# Patient Record
Sex: Female | Born: 1960 | Race: Black or African American | Hispanic: No | Marital: Married | Smoking: Never smoker
Health system: Southern US, Community
[De-identification: ages and names within clinical notes are randomized; demographics above are authoritative.]

## PROBLEM LIST (undated history)

## (undated) DIAGNOSIS — D649 Anemia, unspecified: Secondary | ICD-10-CM

## (undated) HISTORY — PX: OTHER SURGICAL HISTORY: SHX169

---

## 1994-01-07 HISTORY — PX: TUBAL LIGATION: SHX77

## 2000-02-08 ENCOUNTER — Encounter: Admission: RE | Admit: 2000-02-08 | Discharge: 2000-02-08 | Payer: Self-pay | Admitting: Family Medicine

## 2000-02-08 ENCOUNTER — Encounter: Payer: Self-pay | Admitting: Family Medicine

## 2002-03-30 ENCOUNTER — Other Ambulatory Visit: Admission: RE | Admit: 2002-03-30 | Discharge: 2002-03-30 | Payer: Self-pay | Admitting: Obstetrics and Gynecology

## 2005-10-04 ENCOUNTER — Encounter: Admission: RE | Admit: 2005-10-04 | Discharge: 2005-10-04 | Payer: Self-pay | Admitting: Family Medicine

## 2005-11-21 ENCOUNTER — Other Ambulatory Visit: Admission: RE | Admit: 2005-11-21 | Discharge: 2005-11-21 | Payer: Self-pay | Admitting: Obstetrics and Gynecology

## 2006-11-25 ENCOUNTER — Encounter: Admission: RE | Admit: 2006-11-25 | Discharge: 2006-11-25 | Payer: Self-pay | Admitting: Family Medicine

## 2010-10-17 ENCOUNTER — Other Ambulatory Visit: Payer: Self-pay | Admitting: Internal Medicine

## 2010-10-17 ENCOUNTER — Other Ambulatory Visit: Payer: Self-pay | Admitting: Family Medicine

## 2010-10-17 DIAGNOSIS — Z1231 Encounter for screening mammogram for malignant neoplasm of breast: Secondary | ICD-10-CM

## 2010-10-29 ENCOUNTER — Ambulatory Visit
Admission: RE | Admit: 2010-10-29 | Discharge: 2010-10-29 | Disposition: A | Discharge status: Home / Self Care | Payer: 59 | Source: Ambulatory Visit | Attending: Family Medicine | Admitting: Family Medicine

## 2010-10-29 DIAGNOSIS — Z1231 Encounter for screening mammogram for malignant neoplasm of breast: Secondary | ICD-10-CM

## 2010-11-02 ENCOUNTER — Other Ambulatory Visit: Payer: Self-pay | Admitting: Family Medicine

## 2010-11-02 DIAGNOSIS — R928 Other abnormal and inconclusive findings on diagnostic imaging of breast: Secondary | ICD-10-CM

## 2010-11-16 ENCOUNTER — Ambulatory Visit
Admission: RE | Admit: 2010-11-16 | Discharge: 2010-11-16 | Disposition: A | Discharge status: Home / Self Care | Payer: 59 | Source: Ambulatory Visit | Attending: Family Medicine | Admitting: Family Medicine

## 2010-11-16 DIAGNOSIS — R928 Other abnormal and inconclusive findings on diagnostic imaging of breast: Secondary | ICD-10-CM

## 2011-04-09 ENCOUNTER — Other Ambulatory Visit: Payer: Self-pay | Admitting: Family Medicine

## 2011-04-09 DIAGNOSIS — D249 Benign neoplasm of unspecified breast: Secondary | ICD-10-CM

## 2011-05-23 ENCOUNTER — Ambulatory Visit
Admission: RE | Admit: 2011-05-23 | Discharge: 2011-05-23 | Disposition: A | Discharge status: Home / Self Care | Payer: 59 | Source: Ambulatory Visit | Attending: Family Medicine | Admitting: Family Medicine

## 2011-05-23 DIAGNOSIS — D249 Benign neoplasm of unspecified breast: Secondary | ICD-10-CM

## 2011-12-10 ENCOUNTER — Other Ambulatory Visit: Payer: Self-pay | Admitting: Family Medicine

## 2011-12-10 DIAGNOSIS — N63 Unspecified lump in unspecified breast: Secondary | ICD-10-CM

## 2011-12-19 ENCOUNTER — Ambulatory Visit
Admission: RE | Admit: 2011-12-19 | Discharge: 2011-12-19 | Disposition: A | Discharge status: Home / Self Care | Payer: 59 | Source: Ambulatory Visit | Attending: Family Medicine | Admitting: Family Medicine

## 2011-12-19 DIAGNOSIS — N63 Unspecified lump in unspecified breast: Secondary | ICD-10-CM

## 2013-11-19 ENCOUNTER — Other Ambulatory Visit: Payer: Self-pay

## 2013-11-19 DIAGNOSIS — Z1231 Encounter for screening mammogram for malignant neoplasm of breast: Secondary | ICD-10-CM

## 2013-12-01 ENCOUNTER — Ambulatory Visit
Admission: RE | Admit: 2013-12-01 | Discharge: 2013-12-01 | Disposition: A | Discharge status: Home / Self Care | Payer: 59 | Source: Ambulatory Visit

## 2013-12-01 DIAGNOSIS — Z1231 Encounter for screening mammogram for malignant neoplasm of breast: Secondary | ICD-10-CM

## 2014-03-07 HISTORY — PX: COLONOSCOPY: SHX174

## 2014-03-31 ENCOUNTER — Emergency Department (HOSPITAL_COMMUNITY)
Admission: EM | Admit: 2014-03-31 | Discharge: 2014-03-31 | Disposition: A | Discharge status: Home / Self Care | Payer: 59 | Attending: Emergency Medicine | Admitting: Emergency Medicine

## 2014-03-31 ENCOUNTER — Encounter (HOSPITAL_COMMUNITY): Payer: Self-pay | Admitting: Neurology

## 2014-03-31 DIAGNOSIS — N1330 Unspecified hydronephrosis: Secondary | ICD-10-CM | POA: Insufficient documentation

## 2014-03-31 DIAGNOSIS — Z880 Allergy status to penicillin: Secondary | ICD-10-CM | POA: Insufficient documentation

## 2014-03-31 DIAGNOSIS — N201 Calculus of ureter: Secondary | ICD-10-CM | POA: Insufficient documentation

## 2014-03-31 DIAGNOSIS — N200 Calculus of kidney: Secondary | ICD-10-CM | POA: Diagnosis present

## 2014-03-31 LAB — COMPREHENSIVE METABOLIC PANEL
ALT: 16 U/L (ref 0–35)
ANION GAP: 7 (ref 5–15)
AST: 25 U/L (ref 0–37)
Albumin: 3.8 g/dL (ref 3.5–5.2)
Alkaline Phosphatase: 80 U/L (ref 39–117)
BILIRUBIN TOTAL: 0.6 mg/dL (ref 0.3–1.2)
BUN: 14 mg/dL (ref 6–23)
CHLORIDE: 104 mmol/L (ref 96–112)
CO2: 29 mmol/L (ref 19–32)
CREATININE: 1.36 mg/dL — AB (ref 0.50–1.10)
Calcium: 9.3 mg/dL (ref 8.4–10.5)
GFR, EST AFRICAN AMERICAN: 51 mL/min — AB (ref >90)
GFR, EST NON AFRICAN AMERICAN: 44 mL/min — AB (ref >90)
Glucose, Bld: 93 mg/dL (ref 70–99)
Potassium: 3.6 mmol/L (ref 3.5–5.1)
Sodium: 140 mmol/L (ref 135–145)
Total Protein: 7.3 g/dL (ref 6.0–8.3)

## 2014-03-31 LAB — CBC WITH DIFFERENTIAL/PLATELET
BASOS ABS: 0 10*3/uL (ref 0.0–0.1)
Basophils Relative: 0 % (ref 0–1)
EOS PCT: 1 % (ref 0–5)
Eosinophils Absolute: 0.1 10*3/uL (ref 0.0–0.7)
HCT: 37.7 % (ref 36.0–46.0)
Hemoglobin: 12.2 g/dL (ref 12.0–15.0)
LYMPHS PCT: 22 % (ref 12–46)
Lymphs Abs: 3.1 10*3/uL (ref 0.7–4.0)
MCH: 27.9 pg (ref 26.0–34.0)
MCHC: 32.4 g/dL (ref 30.0–36.0)
MCV: 86.1 fL (ref 78.0–100.0)
Monocytes Absolute: 0.9 10*3/uL (ref 0.1–1.0)
Monocytes Relative: 7 % (ref 3–12)
NEUTROS ABS: 9.8 10*3/uL — AB (ref 1.7–7.7)
NEUTROS PCT: 70 % (ref 43–77)
PLATELETS: 273 10*3/uL (ref 150–400)
RBC: 4.38 MIL/uL (ref 3.87–5.11)
RDW: 15.4 % (ref 11.5–15.5)
WBC: 13.9 10*3/uL — AB (ref 4.0–10.5)

## 2014-03-31 LAB — URINALYSIS, ROUTINE W REFLEX MICROSCOPIC
BILIRUBIN URINE: NEGATIVE
Glucose, UA: NEGATIVE mg/dL
KETONES UR: NEGATIVE mg/dL
Leukocytes, UA: NEGATIVE
Nitrite: NEGATIVE
PH: 6 (ref 5.0–8.0)
Protein, ur: NEGATIVE mg/dL
SPECIFIC GRAVITY, URINE: 1.006 (ref 1.005–1.030)
UROBILINOGEN UA: 0.2 mg/dL (ref 0.0–1.0)

## 2014-03-31 LAB — URINE MICROSCOPIC-ADD ON

## 2014-03-31 MED ORDER — FENTANYL CITRATE 0.05 MG/ML IJ SOLN
100.0000 ug | Freq: Once | INTRAMUSCULAR | Status: AC
  Administered 2014-03-31: 100 ug via INTRAVENOUS
  Filled 2014-03-31: qty 2

## 2014-03-31 MED ORDER — SODIUM CHLORIDE 0.9 % IV BOLUS (SEPSIS)
1000.0000 mL | Freq: Once | INTRAVENOUS | Status: AC
  Administered 2014-03-31: 1000 mL via INTRAVENOUS

## 2014-03-31 MED ORDER — ONDANSETRON 4 MG PO TBDP: ORAL_TABLET | ORAL | Status: AC

## 2014-03-31 NOTE — ED Provider Notes (Signed)
CSN: [id number]     Arrival date & time 03/31/14  1538 History   First MD Initiated Contact with Patient 03/31/14 1836     Chief Complaint  Patient presents with  . Nephrolithiasis     (Consider location/radiation/quality/duration/timing/severity/associated sxs/prior Treatment) HPI  54 year old female presents with right-sided flank pain for the past 1 week. Initially intermittent but now is progressively more constant and more severe. The worst pain was early this morning it 4 AM. Currently rates her pain as a 7 out of 10. No known kidney stones in the past. Denies a fevers or chills. Pain now radiating up to her right back. No dysuria. Patient has been taking ibuprofen with moderate relief. She went to her PCP today and had a CT scan done as an outpatient. Was told she had a 5 cm kidney stone on the right and severe blockage of that kidney. Told to go to the ER to be evaluated by urology for immediate surgery.  History reviewed. No pertinent past medical history. History reviewed. No pertinent past surgical history. No family history on file. History  Substance Use Topics  . Smoking status: Never Smoker   . Smokeless tobacco: Not on file  . Alcohol Use: No   OB History    No data available     Review of Systems  Constitutional: Negative for fever and chills.  Gastrointestinal: Positive for nausea and abdominal pain. Negative for vomiting.  Genitourinary: Positive for flank pain. Negative for dysuria.  Musculoskeletal: Positive for back pain.  All other systems reviewed and are negative.     Allergies  Aspirin; Codeine; and Penicillins  Home Medications   Prior to Admission medications   Not on File   BP 143/90 mmHg  Pulse 85  Temp(Src) 98.5 F (36.9 C) (Oral)  Resp 18  SpO2 100%  LMP 03/18/2014 Physical Exam  Constitutional: She is oriented to person, place, and time. She appears well-developed and well-nourished.  HENT:  Head: Normocephalic and atraumatic.   Right Ear: External ear normal.  Left Ear: External ear normal.  Nose: Nose normal.  Eyes: Right eye exhibits no discharge. Left eye exhibits no discharge.  Cardiovascular: Normal rate, regular rhythm and normal heart sounds.   Pulmonary/Chest: Effort normal and breath sounds normal.  Abdominal: Soft. She exhibits no distension. There is no tenderness. There is CVA tenderness (right).  Right flank tenderness  Neurological: She is alert and oriented to person, place, and time.  Skin: Skin is warm and dry. She is not diaphoretic.  Nursing note and vitals reviewed.   ED Course  Procedures (including critical care time) Labs Review Labs Reviewed  CBC WITH DIFFERENTIAL/PLATELET - Abnormal; Notable for the following:    WBC 13.9 (*)    Neutro Abs 9.8 (*)    All other components within normal limits  COMPREHENSIVE METABOLIC PANEL - Abnormal; Notable for the following:    Creatinine, Ser 1.36 (*)    GFR calc non Af Amer 44 (*)    GFR calc Af Amer 51 (*)    All other components within normal limits  URINALYSIS, ROUTINE W REFLEX MICROSCOPIC - Abnormal; Notable for the following:    Hgb urine dipstick SMALL (*)    All other components within normal limits  URINE MICROSCOPIC-ADD ON - Abnormal; Notable for the following:    Squamous Epithelial / LPF FEW (*)    All other components within normal limits  POC URINE PREG, ED    Imaging Review No results found.  EKG Interpretation None      MDM   Final diagnoses:  Right ureteral stone  Hydronephrosis, right    Able to obtain the CT report from outside imaging center, refers to a 5 mm kidney stone in the proximal right ureter. Notes hydronephrosis does not give a severity. Patient's pain controlled with one dose of IV narcotics. No vomiting, fever, or dysuria. Discussed with the urologist on call, Dr. Tresa Moore, who feels patient is stable for discharge after hearing the report and will follow-up patient in their office in 3 days.  (after weekend). Patient advised of strict return precautions. She was already given a strainer, Flomax, and Vicodin by her PCP. Will add on on Zofran as needed. Stable for discharge.    Sherwood Gambler, MD 04/01/14 [phone]

## 2014-03-31 NOTE — ED Notes (Signed)
Pt ambulated to restroom w/ steady gait.

## 2014-03-31 NOTE — Discharge Instructions (Signed)
Kidney Stones °Kidney stones (urolithiasis) are deposits that form inside your kidneys. The intense pain is caused by the stone moving through the urinary tract. When the stone moves, the ureter goes into spasm around the stone. The stone is usually passed in the urine.  °CAUSES  °· A disorder that makes certain neck glands produce too much parathyroid hormone (primary hyperparathyroidism). °· A buildup of uric acid crystals, similar to gout in your joints. °· Narrowing (stricture) of the ureter. °· A kidney obstruction present at birth (congenital obstruction). °· Previous surgery on the kidney or ureters. °· Numerous kidney infections. °SYMPTOMS  °· Feeling sick to your stomach (nauseous). °· Throwing up (vomiting). °· Blood in the urine (hematuria). °· Pain that usually spreads (radiates) to the groin. °· Frequency or urgency of urination. °DIAGNOSIS  °· Taking a history and physical exam. °· Blood or urine tests. °· CT scan. °· Occasionally, an examination of the inside of the urinary bladder (cystoscopy) is performed. °TREATMENT  °· Observation. °· Increasing your fluid intake. °· Extracorporeal shock wave lithotripsy--This is a noninvasive procedure that uses shock waves to break up kidney stones. °· Surgery may be needed if you have severe pain or persistent obstruction. There are various surgical procedures. Most of the procedures are performed with the use of small instruments. Only small incisions are needed to accommodate these instruments, so recovery time is minimized. °The size, location, and chemical composition are all important variables that will determine the proper choice of action for you. Talk to your health care provider to better understand your situation so that you will minimize the risk of injury to yourself and your kidney.  °HOME CARE INSTRUCTIONS  °· Drink enough water and fluids to keep your urine clear or pale yellow. This will help you to pass the stone or stone fragments. °· Strain  all urine through the provided strainer. Keep all particulate matter and stones for your health care provider to see. The stone causing the pain may be as small as a grain of salt. It is very important to use the strainer each and every time you pass your urine. The collection of your stone will allow your health care provider to analyze it and verify that a stone has actually passed. The stone analysis will often identify what you can do to reduce the incidence of recurrences. °· Only take over-the-counter or prescription medicines for pain, discomfort, or fever as directed by your health care provider. °· Make a follow-up appointment with your health care provider as directed. °· Get follow-up X-rays if required. The absence of pain does not always mean that the stone has passed. It may have only stopped moving. If the urine remains completely obstructed, it can cause loss of kidney function or even complete destruction of the kidney. It is your responsibility to make sure X-rays and follow-ups are completed. Ultrasounds of the kidney can show blockages and the status of the kidney. Ultrasounds are not associated with any radiation and can be performed easily in a matter of minutes. °SEEK MEDICAL CARE IF: °· You experience pain that is progressive and unresponsive to any pain medicine you have been prescribed. °SEEK IMMEDIATE MEDICAL CARE IF:  °· Pain cannot be controlled with the prescribed medicine. °· You have a fever or shaking chills. °· The severity or intensity of pain increases over 18 hours and is not relieved by pain medicine. °· You develop a new onset of abdominal pain. °· You feel faint or pass out. °·   You are unable to urinate. MAKE SURE YOU:   Understand these instructions.  Will watch your condition.  Will get help right away if you are not doing well or get worse. Document Released: 12/24/2004 Document Revised: 08/26/2012 Document Reviewed: 05/27/2012 [facility] Patient Information 2015  [place], [place]. This information is not intended to replace advice given to you by your health care provider. Make sure you discuss any questions you have with your health care provider.     Hydronephrosis Hydronephrosis is an abnormal enlargement of your kidney. It can affect one or both the kidneys. It results from the backward pressure of urine on the kidneys, when the flow of urine is blocked. Normally, the urine drains from the kidney through the urine tube (ureter), into a sac which holds the urine until urination (bladder). When the urinary flow is blocked, the urine collects above the block. This causes an increase in the pressure inside the kidney, which in turn leads to its enlargement. The block can occur at the point where the kidney joins the ureter. Treatment depends on the cause and location of the block.  CAUSES  The causes of this condition include:  Birth defect of the kidney or ureter.  Kink at the point where the kidney joins the ureter.  Stones and blood clots in the kidney or ureter.  Cancer, injury, or infection of the ureter.  Scar tissue formation.  Backflow of urine (reflux).  Cancer of bladder or prostate gland.  Abnormality of the nerves or muscles of the kidney or ureter.  Lower part of the ureter protruding into the bladder (ureterocele).  Abnormal contractions of the bladder.  Both the kidneys can be affected during pregnancy. This is because the enlarging uterus presses on the ureters and blocks the flow of urine. SYMPTOMS  The symptoms depend on the location of the block. They also depend on how long the block has been present. You may feel pain on the affected side. Sometimes, you may not have any symptoms. There may be a dull ache or discomfort in the flank. The common symptoms are:  Flank pain.  Swelling of the abdomen.  Pain in the abdomen.  Nausea and vomiting.  Fever.  Pain while passing urine.  Urgency for urination.  Frequent or  urgent urination.  Infection of the urinary tract. DIAGNOSIS  Your caregiver will examine you after asking about your symptoms. You may be asked to do blood and urine tests. Your caregiver may order a special X-ray, ultrasound, or CT scan. Sometimes a rigid or flexible telescope (cystoscope) is used to view the site of the blockage.  TREATMENT  Treatment depends on the site, cause, and duration of the block. The goal of treatment is to remove the blockage. Your caregiver will plan the treatment based on your condition. The different types of treatment are:   Putting in a soft plastic tube (ureteral stent) to connect the bladder with the kidney. This will help in draining the urine.  Putting in a soft tube (nephrostomy tube). This is placed through skin into the kidney. The trapped urine is drained out through the back. A plastic bag is attached to your skin to hold the urine that has drained out.  Antibiotics to treat or prevent infection.  Breaking down of the stone (lithotripsy). HOME CARE INSTRUCTIONS   It may take some time for the hydronephrosis to go away (resolve). Drink fluids as directed by your caregiver , and get a lot of rest.  If you  have a drain in, your caregiver will give you directions about how to care for it. Be sure you understand these directions completely before you go home.  Take any antibiotics, pain medications, or other prescriptions exactly as prescribed.  Follow-up with your caregivers as directed. SEEK MEDICAL CARE IF:   You continue to have flank pain, nausea, or difficulty with urination.  You have any problem with any type of drainage device.  Your urine becomes cloudy or bloody. SEEK IMMEDIATE MEDICAL CARE IF:   You have severe flank and/or abdominal pain.  You develop vomiting and are unable to hold down fluids.  You develop a fever above 100.5 F (38.1 C), or as per your caregiver. MAKE SURE YOU:   Understand these instructions.  Will  watch your condition.  Will get help right away if you are not doing well or get worse. Document Released: 10/21/2006 Document Revised: 03/18/2011 Document Reviewed: 12/07/2009 [facility] Patient Information 2015 [place], [place]. This information is not intended to replace advice given to you by your health care provider. Make sure you discuss any questions you have with your health care provider.

## 2014-03-31 NOTE — ED Notes (Addendum)
Pt reports 5 mm kidney stone to right sisde, sent here from [facility] to have kidney stone removed. Reports had CT scan and told fluid is backing up. Pt is a x 4. In NAD

## 2014-04-06 ENCOUNTER — Other Ambulatory Visit: Payer: Self-pay | Admitting: Urology

## 2014-04-07 ENCOUNTER — Encounter (HOSPITAL_COMMUNITY): Payer: Self-pay | Admitting: *Deleted

## 2014-04-14 ENCOUNTER — Ambulatory Visit (HOSPITAL_COMMUNITY): Payer: 59

## 2014-04-14 ENCOUNTER — Encounter (HOSPITAL_COMMUNITY)
Admission: RE | Disposition: A | Discharge status: Home / Self Care | Payer: Self-pay | Source: Ambulatory Visit | Attending: Urology

## 2014-04-14 ENCOUNTER — Encounter (HOSPITAL_COMMUNITY): Payer: Self-pay | Admitting: General Practice

## 2014-04-14 ENCOUNTER — Ambulatory Visit (HOSPITAL_COMMUNITY)
Admission: RE | Admit: 2014-04-14 | Discharge: 2014-04-14 | Disposition: A | Discharge status: Home / Self Care | Payer: 59 | Source: Ambulatory Visit | Attending: Urology | Admitting: Urology

## 2014-04-14 DIAGNOSIS — Z888 Allergy status to other drugs, medicaments and biological substances status: Secondary | ICD-10-CM | POA: Diagnosis not present

## 2014-04-14 DIAGNOSIS — N201 Calculus of ureter: Secondary | ICD-10-CM | POA: Diagnosis not present

## 2014-04-14 DIAGNOSIS — Z880 Allergy status to penicillin: Secondary | ICD-10-CM | POA: Diagnosis not present

## 2014-04-14 DIAGNOSIS — Z886 Allergy status to analgesic agent status: Secondary | ICD-10-CM | POA: Insufficient documentation

## 2014-04-14 HISTORY — DX: Anemia, unspecified: D64.9

## 2014-04-14 LAB — PREGNANCY, URINE: PREG TEST UR: NEGATIVE

## 2014-04-14 SURGERY — LITHOTRIPSY, ESWL
Anesthesia: LOCAL | Laterality: Right

## 2014-04-14 MED ORDER — DIAZEPAM 5 MG PO TABS
10.0000 mg | ORAL_TABLET | ORAL | Status: AC
  Administered 2014-04-14: 10 mg via ORAL
  Filled 2014-04-14: qty 2

## 2014-04-14 MED ORDER — CIPROFLOXACIN HCL 500 MG PO TABS
500.0000 mg | ORAL_TABLET | ORAL | Status: AC
  Administered 2014-04-14: 500 mg via ORAL
  Filled 2014-04-14: qty 1

## 2014-04-14 MED ORDER — DEXTROSE-NACL 5-0.45 % IV SOLN
INTRAVENOUS | Status: DC
  Administered 2014-04-14: 12:00:00 via INTRAVENOUS

## 2014-04-14 MED ORDER — DIPHENHYDRAMINE HCL 25 MG PO CAPS
25.0000 mg | ORAL_CAPSULE | ORAL | Status: AC
  Administered 2014-04-14: 25 mg via ORAL
  Filled 2014-04-14: qty 1

## 2014-04-14 NOTE — Op Note (Signed)
Refer to [facility] Op Note scanned in the chart 

## 2014-04-14 NOTE — H&P (Signed)
History of Present Illness Sandra Vazquez has been complaining of right sided abdominal pain on and off since July. The pain has been dull in nature and of short duration. She takes ibuprofen as needed and that usually relieved the pain.  The pain has been more frequent over the last 2 weeks. It was severe about 8 days ago. She took ibuprofen and went to see by her PCP who requested a CT scan that showed a 5 mm right proximal ureteral calculus with mild to moderate hydronephrosis. She was then referred to the ER where she was given fentanyl that relieved her pain. She was discharged home on tamsulosin, oxycodone and ondansetron. She has been taking hydrocodone on and off. She does not have any discomfort at this time. The pain is associated with nausea, no vomiting. She denies any voiding symptoms. Her serum calcium is 9.3. Creatinine is 1.36.   Past Medical History Problems  1. History of No acute medical problems  Surgical History Problems  1. History of Cesarean Section 2. History of Oral Surgery Tooth Extraction  Current Meds 1. Allegra CAPS;  Therapy: (Recorded:29Mar2016) to Recorded 2. Beano TABS;  Therapy: (Recorded:29Mar2016) to Recorded 3. Ferrous Sulfate 325 MG CAPS;  Therapy: (Recorded:29Mar2016) to Recorded 4. Flomax 0.4 MG Oral Capsule (Tamsulosin HCl);  Therapy: (Recorded:29Mar2016) to Recorded 5. Hydrocodone-Acetaminophen CAPS;  Therapy: (Recorded:29Mar2016) to Recorded 6. Ibuprofen 200 MG Oral Tablet;  Therapy: (Recorded:29Mar2016) to Recorded 7. Multi Vitamin/Minerals TABS;  Therapy: (Recorded:29Mar2016) to Recorded  Allergies Medication  1. Aspirin TABS 2. Codeine Derivatives 3. Penicillins  Family History Problems  1. Family history of kidney stones (Z84.1) : Paternal Grandmother 2. No pertinent family history : Mother, Father  Social History Problems    Denied: History of Alcohol use   Caffeine use (F15.90)   Married   Never a smoker   Occupation    Science writer Work   Two children  Review of Systems Genitourinary, constitutional, skin, eye, otolaryngeal, hematologic/lymphatic, cardiovascular, pulmonary, endocrine, musculoskeletal, gastrointestinal, neurological and psychiatric system(s) were reviewed and pertinent findings if present are noted and are otherwise negative.  Genitourinary: nocturia.  Gastrointestinal: nausea, flank pain and abdominal pain.    Vitals Vital Signs [Data Includes: Last 1 Day]  Recorded: 29Mar2016 02:37PM  Height: 5 ft 0.5 in Weight: 185 lb  BMI Calculated: 35.54 BSA Calculated: 1.82 Blood Pressure: 127 / 88 Temperature: 97.7 F Heart Rate: 105  Physical Exam Constitutional: Well nourished and well developed . No acute distress.  ENT:. The ears and nose are normal in appearance.  Neck: The appearance of the neck is normal and no neck mass is present.  Pulmonary: No respiratory distress and normal respiratory rhythm and effort.  Cardiovascular: Heart rate and rhythm are normal . No peripheral edema.  Abdomen: The abdomen is soft and nontender. No masses are palpated. Mild tenderness in the RLQ is present. mild right CVA tenderness no CVA tenderness. No hernias are palpable. No hepatosplenomegaly noted.  Genitourinary:  Chaperone Present: Berneice Gandy.  Lymphatics: The femoral and inguinal nodes are not enlarged or tender.  Skin: Normal skin turgor, no visible rash and no visible skin lesions.  Neuro/Psych:. Mood and affect are appropriate.    Results/Data Urine [Data Includes: Last 1 Day]   [id number]  COLOR STRAW   APPEARANCE CLEAR   SPECIFIC GRAVITY 1.010   pH 5.5   GLUCOSE NEG mg/dL  BILIRUBIN NEG   KETONE NEG mg/dL  BLOOD NEG   PROTEIN NEG mg/dL  UROBILINOGEN 0.2 mg/dL  NITRITE  NEG   LEUKOCYTE ESTERASE NEG    I independently reviewed the CT scan and the findings are as noted above.   Assessment Assessed  1. Calculus of proximal right ureter (N20.1) 2. Hydronephrosis, right  (N13.30)  Plan Health Maintenance  1. UA With REFLEX; [Do Not Release]; Status:Complete;   Done: [id number] 01:52PM  I went over the treatment options with the patient, her husband and her daughter: medical expulsive therapy, ESL, ureteroscopy with stone manipulation. the risks, benefits of each option were reviewed. After lengthy discussion with her family she opted to proceed with ESL. The risks of ESL include but are not limited to hemorrhage, renal or perirenal hematoma, injury to adjacent organs, inability to fragment the stone, steinstrasse. They understand and she wishes to proceed.

## 2014-11-02 ENCOUNTER — Other Ambulatory Visit: Payer: Self-pay

## 2014-11-02 DIAGNOSIS — Z1231 Encounter for screening mammogram for malignant neoplasm of breast: Secondary | ICD-10-CM

## 2014-12-06 ENCOUNTER — Ambulatory Visit
Admission: RE | Admit: 2014-12-06 | Discharge: 2014-12-06 | Disposition: A | Discharge status: Home / Self Care | Payer: 59 | Source: Ambulatory Visit

## 2014-12-06 DIAGNOSIS — Z1231 Encounter for screening mammogram for malignant neoplasm of breast: Secondary | ICD-10-CM

## 2016-01-09 DIAGNOSIS — Z01411 Encounter for gynecological examination (general) (routine) with abnormal findings: Secondary | ICD-10-CM | POA: Diagnosis not present

## 2016-01-09 DIAGNOSIS — Z1159 Encounter for screening for other viral diseases: Secondary | ICD-10-CM | POA: Diagnosis not present

## 2016-01-09 DIAGNOSIS — Z124 Encounter for screening for malignant neoplasm of cervix: Secondary | ICD-10-CM | POA: Diagnosis not present

## 2016-01-09 DIAGNOSIS — Z0000 Encounter for general adult medical examination without abnormal findings: Secondary | ICD-10-CM | POA: Diagnosis not present

## 2017-01-14 DIAGNOSIS — Z131 Encounter for screening for diabetes mellitus: Secondary | ICD-10-CM | POA: Diagnosis not present

## 2017-01-14 DIAGNOSIS — Z0000 Encounter for general adult medical examination without abnormal findings: Secondary | ICD-10-CM | POA: Diagnosis not present

## 2017-07-15 DIAGNOSIS — H25013 Cortical age-related cataract, bilateral: Secondary | ICD-10-CM | POA: Diagnosis not present

## 2017-11-28 ENCOUNTER — Other Ambulatory Visit: Payer: Self-pay | Admitting: Family Medicine

## 2017-11-28 DIAGNOSIS — Z1231 Encounter for screening mammogram for malignant neoplasm of breast: Secondary | ICD-10-CM

## 2017-12-18 ENCOUNTER — Ambulatory Visit
Admission: RE | Admit: 2017-12-18 | Discharge: 2017-12-18 | Disposition: A | Discharge status: Home / Self Care | Payer: 59 | Source: Ambulatory Visit | Attending: Family Medicine | Admitting: Family Medicine

## 2017-12-18 DIAGNOSIS — Z1231 Encounter for screening mammogram for malignant neoplasm of breast: Secondary | ICD-10-CM | POA: Diagnosis not present

## 2018-01-12 DIAGNOSIS — Z01411 Encounter for gynecological examination (general) (routine) with abnormal findings: Secondary | ICD-10-CM | POA: Diagnosis not present

## 2018-01-12 DIAGNOSIS — Z6836 Body mass index (BMI) 36.0-36.9, adult: Secondary | ICD-10-CM | POA: Diagnosis not present

## 2018-01-12 DIAGNOSIS — Z124 Encounter for screening for malignant neoplasm of cervix: Secondary | ICD-10-CM | POA: Diagnosis not present

## 2018-02-04 DIAGNOSIS — E785 Hyperlipidemia, unspecified: Secondary | ICD-10-CM | POA: Diagnosis not present

## 2018-02-04 DIAGNOSIS — D649 Anemia, unspecified: Secondary | ICD-10-CM | POA: Diagnosis not present

## 2018-02-04 DIAGNOSIS — Z0000 Encounter for general adult medical examination without abnormal findings: Secondary | ICD-10-CM | POA: Diagnosis not present

## 2018-02-04 DIAGNOSIS — Z131 Encounter for screening for diabetes mellitus: Secondary | ICD-10-CM | POA: Diagnosis not present

## 2019-01-20 ENCOUNTER — Other Ambulatory Visit: Payer: Self-pay | Admitting: Family Medicine

## 2019-01-20 DIAGNOSIS — Z1231 Encounter for screening mammogram for malignant neoplasm of breast: Secondary | ICD-10-CM

## 2019-01-21 ENCOUNTER — Ambulatory Visit
Admission: RE | Admit: 2019-01-21 | Discharge: 2019-01-21 | Disposition: A | Discharge status: Home / Self Care | Payer: 59 | Source: Ambulatory Visit | Attending: Family Medicine | Admitting: Family Medicine

## 2019-01-21 ENCOUNTER — Other Ambulatory Visit: Payer: Self-pay

## 2019-01-21 DIAGNOSIS — Z1231 Encounter for screening mammogram for malignant neoplasm of breast: Secondary | ICD-10-CM

## 2020-02-11 ENCOUNTER — Other Ambulatory Visit: Payer: Self-pay | Admitting: Family Medicine

## 2020-02-11 DIAGNOSIS — Z1231 Encounter for screening mammogram for malignant neoplasm of breast: Secondary | ICD-10-CM

## 2020-02-23 DIAGNOSIS — Z1231 Encounter for screening mammogram for malignant neoplasm of breast: Secondary | ICD-10-CM

## 2020-04-06 ENCOUNTER — Other Ambulatory Visit: Payer: Self-pay

## 2020-04-06 ENCOUNTER — Ambulatory Visit
Admission: RE | Admit: 2020-04-06 | Discharge: 2020-04-06 | Disposition: A | Discharge status: Home / Self Care | Payer: 59 | Source: Ambulatory Visit | Attending: Family Medicine | Admitting: Family Medicine

## 2020-04-06 DIAGNOSIS — Z1231 Encounter for screening mammogram for malignant neoplasm of breast: Secondary | ICD-10-CM

## 2020-04-06 IMAGING — MG MM DIGITAL SCREENING BILAT W/ TOMO AND CAD
8 series · 8 of 24 positions shown · non-contrast
Comparison: Previous exam(s).

CLINICAL DATA: Screening.

EXAM:
DIGITAL SCREENING BILATERAL MAMMOGRAM WITH TOMOSYNTHESIS AND CAD
TECHNIQUE: Bilateral screening digital craniocaudal and mediolateral oblique
mammograms were obtained. Bilateral screening digital breast
tomosynthesis was performed. The images were evaluated with
computer-aided detection.

[R MLO synth-2D]
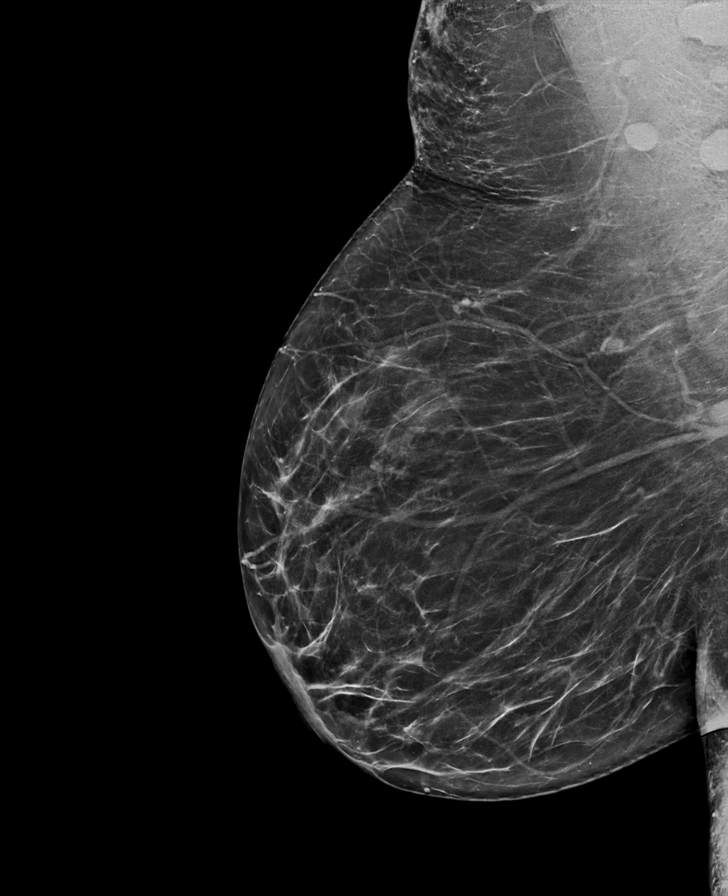

[L CC synth-2D]
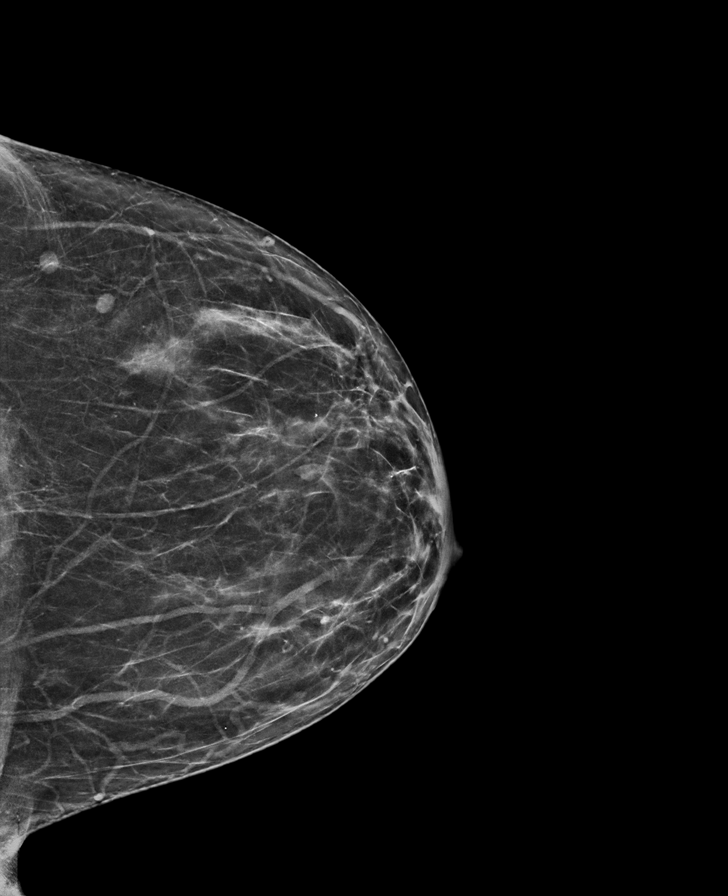

[L MLO synth-2D]
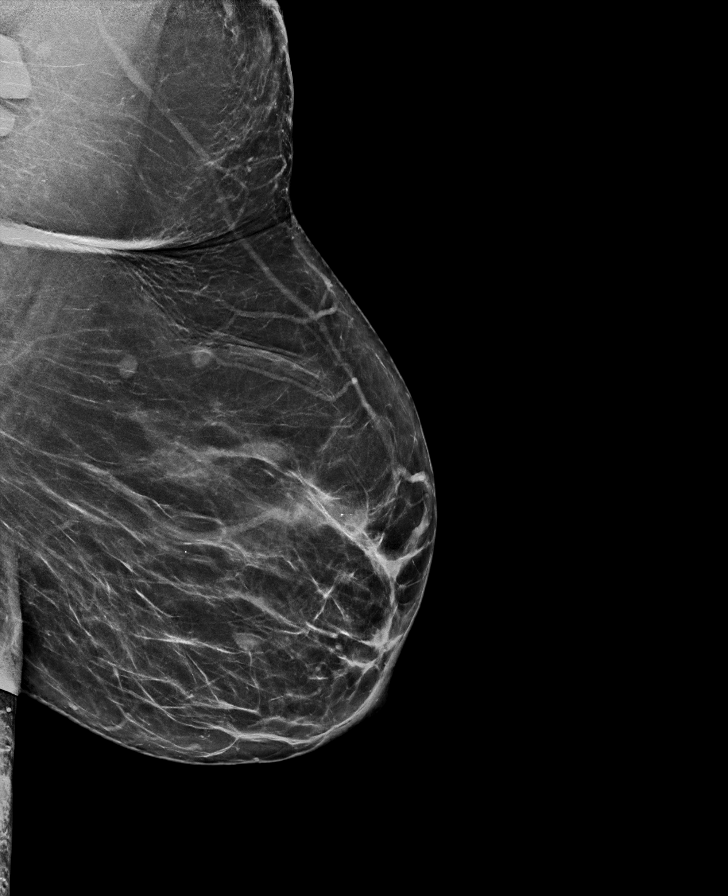

[R CC synth-2D]
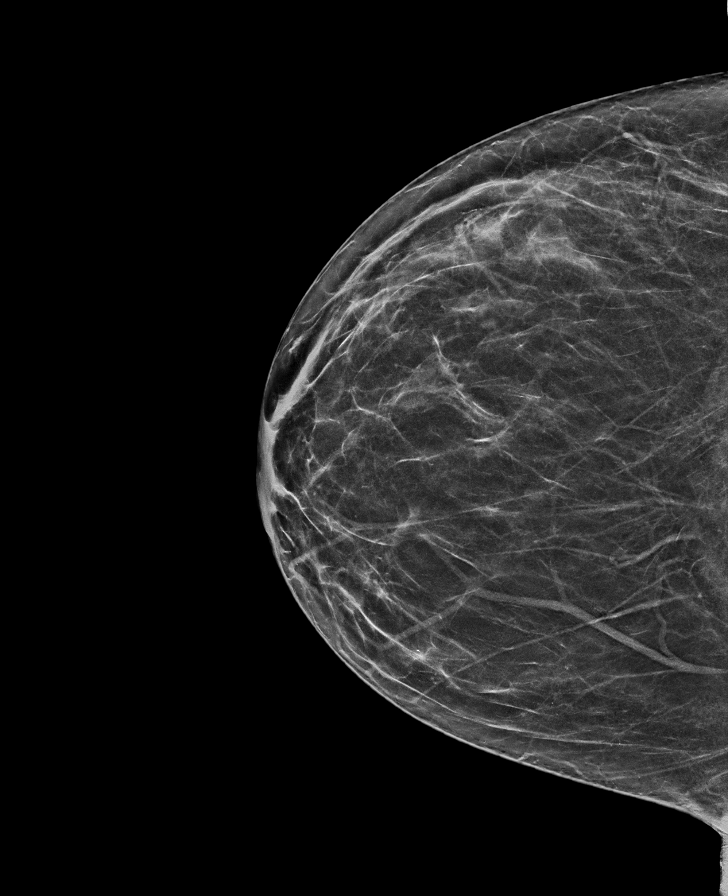

[L MLO tomo · tomo slice 37/74.0]
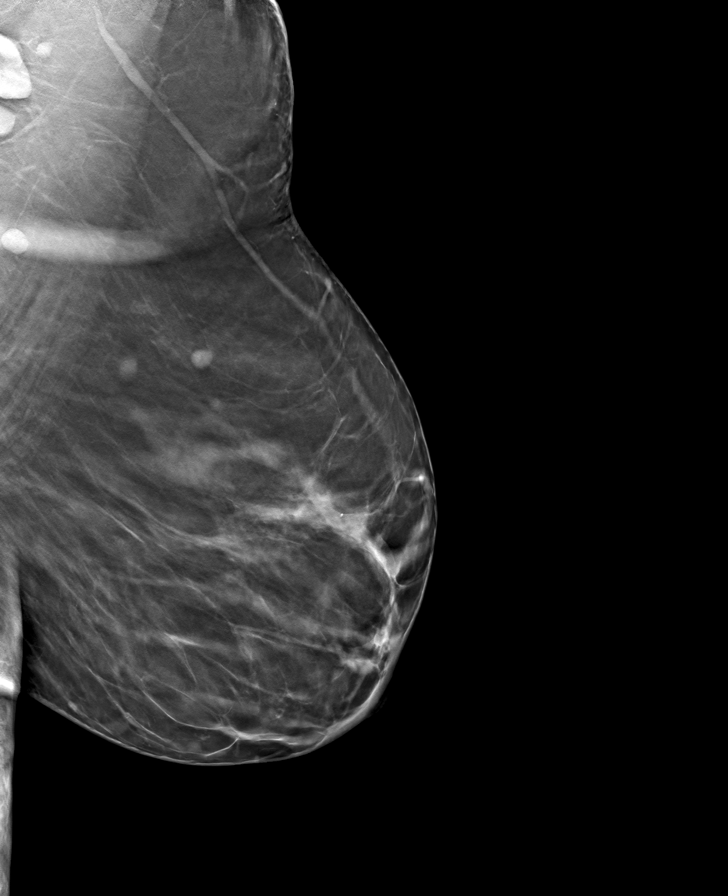

[R CC tomo · tomo slice 32/63.0]
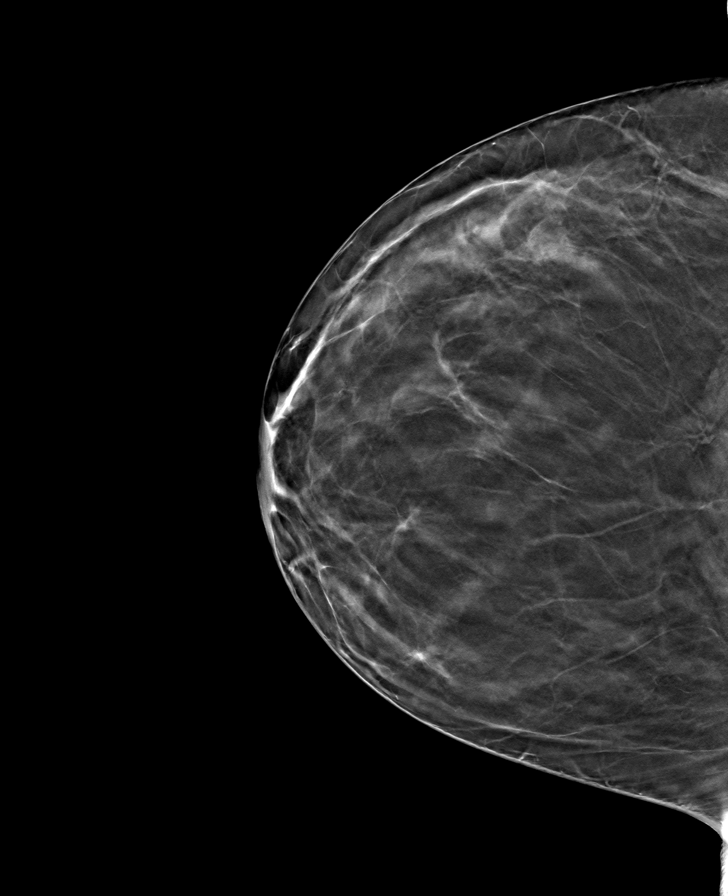

[R MLO tomo · tomo slice 37/74.0]
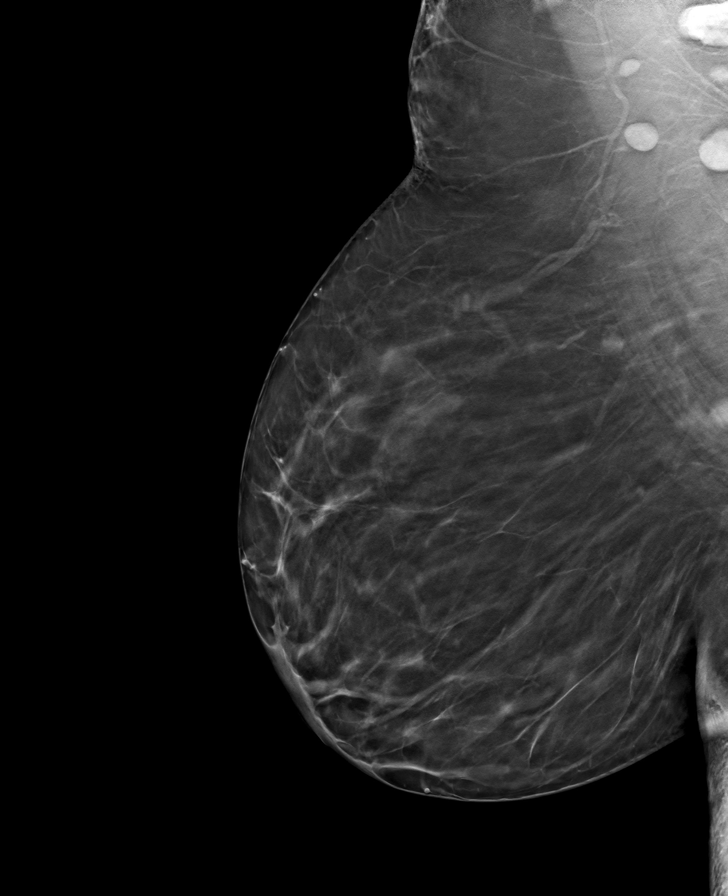

[L CC tomo · tomo slice 33/65.0]
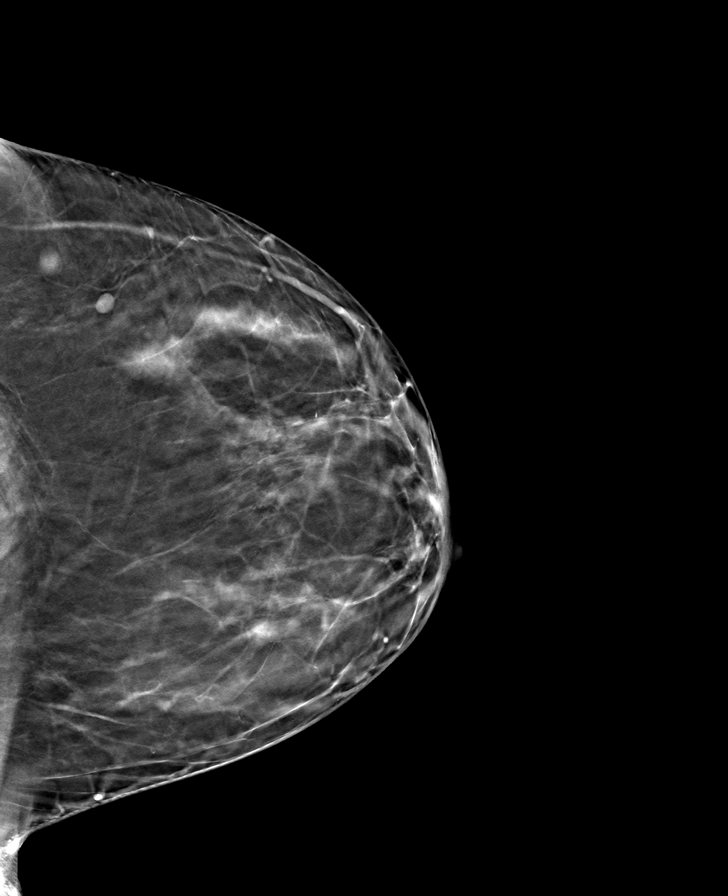

[8 of 24 positions shown; findings below may reference images not displayed]

ACR Breast Density Category b: There are scattered areas of
fibroglandular density.
FINDINGS: There are no findings suspicious for malignancy. The images were
evaluated with computer-aided detection.
IMPRESSION: No mammographic evidence of malignancy. A result letter of this
screening mammogram will be mailed directly to the patient.

RECOMMENDATION:
Screening mammogram in one year. (Code:[OD])

BI-RADS CATEGORY  1: Negative.

## 2020-05-08 ENCOUNTER — Ambulatory Visit: Payer: 59 | Admitting: Podiatry

## 2020-05-08 ENCOUNTER — Ambulatory Visit (INDEPENDENT_AMBULATORY_CARE_PROVIDER_SITE_OTHER): Payer: 59

## 2020-05-08 ENCOUNTER — Encounter: Payer: Self-pay | Admitting: Podiatry

## 2020-05-08 ENCOUNTER — Other Ambulatory Visit: Payer: Self-pay

## 2020-05-08 DIAGNOSIS — G8929 Other chronic pain: Secondary | ICD-10-CM | POA: Diagnosis not present

## 2020-05-08 DIAGNOSIS — M79672 Pain in left foot: Secondary | ICD-10-CM

## 2020-05-08 DIAGNOSIS — N760 Acute vaginitis: Secondary | ICD-10-CM | POA: Insufficient documentation

## 2020-05-08 DIAGNOSIS — M722 Plantar fascial fibromatosis: Secondary | ICD-10-CM

## 2020-05-08 DIAGNOSIS — M79671 Pain in right foot: Secondary | ICD-10-CM | POA: Diagnosis not present

## 2020-05-08 DIAGNOSIS — Z87442 Personal history of urinary calculi: Secondary | ICD-10-CM | POA: Insufficient documentation

## 2020-05-08 DIAGNOSIS — B9689 Other specified bacterial agents as the cause of diseases classified elsewhere: Secondary | ICD-10-CM | POA: Insufficient documentation

## 2020-05-08 DIAGNOSIS — J309 Allergic rhinitis, unspecified: Secondary | ICD-10-CM | POA: Insufficient documentation

## 2020-05-08 DIAGNOSIS — M216X1 Other acquired deformities of right foot: Secondary | ICD-10-CM | POA: Diagnosis not present

## 2020-05-08 DIAGNOSIS — R102 Pelvic and perineal pain: Secondary | ICD-10-CM | POA: Insufficient documentation

## 2020-05-08 DIAGNOSIS — E785 Hyperlipidemia, unspecified: Secondary | ICD-10-CM | POA: Insufficient documentation

## 2020-05-08 DIAGNOSIS — N926 Irregular menstruation, unspecified: Secondary | ICD-10-CM | POA: Insufficient documentation

## 2020-05-08 DIAGNOSIS — E669 Obesity, unspecified: Secondary | ICD-10-CM | POA: Insufficient documentation

## 2020-05-08 MED ORDER — METHYLPREDNISOLONE 4 MG PO TBPK: ORAL_TABLET | ORAL | 0 refills | Status: AC

## 2020-05-08 NOTE — Patient Instructions (Signed)
For instructions on how to put on your Night Splint, please visit [email or web address]   Plantar Fasciitis (Heel Spur Syndrome) with Rehab The plantar fascia is a fibrous, ligament-like, soft-tissue structure that spans the bottom of the foot. Plantar fasciitis is a condition that causes pain in the foot due to inflammation of the tissue. SYMPTOMS   Pain and tenderness on the underneath side of the foot.  Pain that worsens with standing or walking. CAUSES  Plantar fasciitis is caused by irritation and injury to the plantar fascia on the underneath side of the foot. Common mechanisms of injury include:  Direct trauma to bottom of the foot.  Damage to a small nerve that runs under the foot where the main fascia attaches to the heel bone.  Stress placed on the plantar fascia due to bone spurs. RISK INCREASES WITH:   Activities that place stress on the plantar fascia (running, jumping, pivoting, or cutting).  Poor strength and flexibility.  Improperly fitted shoes.  Tight calf muscles.  Flat feet.  Failure to warm-up properly before activity.  Obesity. PREVENTION  Warm up and stretch properly before activity.  Allow for adequate recovery between workouts.  Maintain physical fitness:  Strength, flexibility, and endurance.  Cardiovascular fitness.  Maintain a health body weight.  Avoid stress on the plantar fascia.  Wear properly fitted shoes, including arch supports for individuals who have flat feet.  PROGNOSIS  If treated properly, then the symptoms of plantar fasciitis usually resolve without surgery. However, occasionally surgery is necessary.  RELATED COMPLICATIONS   Recurrent symptoms that may result in a chronic condition.  Problems of the lower back that are caused by compensating for the injury, such as limping.  Pain or weakness of the foot during push-off following surgery.  Chronic inflammation, scarring, and partial or complete fascia tear,  occurring more often from repeated injections.  TREATMENT  Treatment initially involves the use of ice and medication to help reduce pain and inflammation. The use of strengthening and stretching exercises may help reduce pain with activity, especially stretches of the Achilles tendon. These exercises may be performed at home or with a therapist. Your caregiver may recommend that you use heel cups of arch supports to help reduce stress on the plantar fascia. Occasionally, corticosteroid injections are given to reduce inflammation. If symptoms persist for greater than 6 months despite non-surgical (conservative), then surgery may be recommended.   MEDICATION   If pain medication is necessary, then nonsteroidal anti-inflammatory medications, such as aspirin and ibuprofen, or other minor pain relievers, such as acetaminophen, are often recommended.  Do not take pain medication within 7 days before surgery.  Prescription pain relievers may be given if deemed necessary by your caregiver. Use only as directed and only as much as you need.  Corticosteroid injections may be given by your caregiver. These injections should be reserved for the most serious cases, because they may only be given a certain number of times.  HEAT AND COLD  Cold treatment (icing) relieves pain and reduces inflammation. Cold treatment should be applied for 10 to 15 minutes every 2 to 3 hours for inflammation and pain and immediately after any activity that aggravates your symptoms. Use ice packs or massage the area with a piece of ice (ice massage).  Heat treatment may be used prior to performing the stretching and strengthening activities prescribed by your caregiver, physical therapist, or athletic trainer. Use a heat pack or soak the injury in warm water.  SEEK IMMEDIATE MEDICAL CARE   IF:  Treatment seems to offer no benefit, or the condition worsens.  Any medications produce adverse side effects.  EXERCISES- RANGE OF  MOTION (ROM) AND STRETCHING EXERCISES - Plantar Fasciitis (Heel Spur Syndrome) These exercises may help you when beginning to rehabilitate your injury. Your symptoms may resolve with or without further involvement from your physician, physical therapist or athletic trainer. While completing these exercises, remember:   Restoring tissue flexibility helps normal motion to return to the joints. This allows healthier, less painful movement and activity.  An effective stretch should be held for at least 30 seconds.  A stretch should never be painful. You should only feel a gentle lengthening or release in the stretched tissue.  RANGE OF MOTION - Toe Extension, Flexion  Sit with your right / left leg crossed over your opposite knee.  Grasp your toes and gently pull them back toward the top of your foot. You should feel a stretch on the bottom of your toes and/or foot.  Hold this stretch for 10 seconds.  Now, gently pull your toes toward the bottom of your foot. You should feel a stretch on the top of your toes and or foot.  Hold this stretch for 10 seconds. Repeat  times. Complete this stretch 3 times per day.   RANGE OF MOTION - Ankle Dorsiflexion, Active Assisted  Remove shoes and sit on a chair that is preferably not on a carpeted surface.  Place right / left foot under knee. Extend your opposite leg for support.  Keeping your heel down, slide your right / left foot back toward the chair until you feel a stretch at your ankle or calf. If you do not feel a stretch, slide your bottom forward to the edge of the chair, while still keeping your heel down.  Hold this stretch for 10 seconds. Repeat 3 times. Complete this stretch 2 times per day.   STRETCH  Gastroc, Standing  Place hands on wall.  Extend right / left leg, keeping the front knee somewhat bent.  Slightly point your toes inward on your back foot.  Keeping your right / left heel on the floor and your knee straight, shift  your weight toward the wall, not allowing your back to arch.  You should feel a gentle stretch in the right / left calf. Hold this position for 10 seconds. Repeat 3 times. Complete this stretch 2 times per day.  STRETCH  Soleus, Standing  Place hands on wall.  Extend right / left leg, keeping the other knee somewhat bent.  Slightly point your toes inward on your back foot.  Keep your right / left heel on the floor, bend your back knee, and slightly shift your weight over the back leg so that you feel a gentle stretch deep in your back calf.  Hold this position for 10 seconds. Repeat 3 times. Complete this stretch 2 times per day.  STRETCH  Gastrocsoleus, Standing  Note: This exercise can place a lot of stress on your foot and ankle. Please complete this exercise only if specifically instructed by your caregiver.   Place the ball of your right / left foot on a step, keeping your other foot firmly on the same step.  Hold on to the wall or a rail for balance.  Slowly lift your other foot, allowing your body weight to press your heel down over the edge of the step.  You should feel a stretch in your right / left calf.  Hold this position   for 10 seconds.  Repeat this exercise with a slight bend in your right / left knee. Repeat 3 times. Complete this stretch 2 times per day.   STRENGTHENING EXERCISES - Plantar Fasciitis (Heel Spur Syndrome)  These exercises may help you when beginning to rehabilitate your injury. They may resolve your symptoms with or without further involvement from your physician, physical therapist or athletic trainer. While completing these exercises, remember:   Muscles can gain both the endurance and the strength needed for everyday activities through controlled exercises.  Complete these exercises as instructed by your physician, physical therapist or athletic trainer. Progress the resistance and repetitions only as guided.  STRENGTH - Towel Curls  Sit in  a chair positioned on a non-carpeted surface.  Place your foot on a towel, keeping your heel on the floor.  Pull the towel toward your heel by only curling your toes. Keep your heel on the floor. Repeat 3 times. Complete this exercise 2 times per day.  STRENGTH - Ankle Inversion  Secure one end of a rubber exercise band/tubing to a fixed object (table, pole). Loop the other end around your foot just before your toes.  Place your fists between your knees. This will focus your strengthening at your ankle.  Slowly, pull your big toe up and in, making sure the band/tubing is positioned to resist the entire motion.  Hold this position for 10 seconds.  Have your muscles resist the band/tubing as it slowly pulls your foot back to the starting position. Repeat 3 times. Complete this exercises 2 times per day.  Document Released: 12/24/2004 Document Revised: 03/18/2011 Document Reviewed: 04/07/2008 [employer] Patient Information 2014 [employer].  

## 2020-05-10 NOTE — Progress Notes (Signed)
Subjective:   Patient ID: Sandra Vazquez, female   DOB: 60 y.o.   MRN: [id number]   HPI 60 year old female presents the office with concerns of right heel pain which is been ongoing for last 1.5 months.  She did get some occasional swelling.  She gets discomfort mostly if she sits and stands back up in the morning when she first gets up.  She described discomfort mostly to the plantar lateral aspect of the heel.  She is tried stretching which does help temporarily.  She has tried ibuprofen.  No injuries.   Review of Systems  All other systems reviewed and are negative.  Past Medical History:  Diagnosis Date  . Anemia    on iron    Past Surgical History:  Procedure Laterality Date  . CESAREAN SECTION     x2  . COLONOSCOPY  02.29.2016  . TUBAL LIGATION  1996  . widsom teeth       Current Outpatient Medications:  .  methylPREDNISolone (MEDROL DOSEPAK) 4 MG TBPK tablet, Take as directed, Disp: 21 tablet, Rfl: 0 .  Alpha-D-Galactosidase (BEANO) TABS, Take 1 tablet by mouth daily as needed (enzymes when eating certain foods.). , Disp: , Rfl:  .  Apple Cider Vinegar 500 MG TABS, , Disp: , Rfl:  .  Biotin w/ Vitamins C & E (HAIR SKIN & NAILS GUMMIES PO), , Disp: , Rfl:  .  cyclobenzaprine (FLEXERIL) 5 MG tablet, cyclobenzaprine 5 mg tablet, Disp: , Rfl:  .  Digestive Enzyme CAPS, See admin instructions., Disp: , Rfl:  .  famotidine (PEPCID) 20 MG tablet, 1 tablet as needed, Disp: , Rfl:  .  ferrous sulfate 325 (65 FE) MG tablet, Take 325 mg by mouth every evening. , Disp: , Rfl:  .  fexofenadine (ALLEGRA) 180 MG tablet, Take 180 mg by mouth daily as needed for allergies. , Disp: , Rfl:  .  HYDROcodone-acetaminophen (NORCO/VICODIN) 5-325 MG per tablet, Take 1 tablet by mouth every 6 (six) hours as needed for moderate pain., Disp: , Rfl:  .  ibuprofen (ADVIL,MOTRIN) 200 MG tablet, Take 200-400 mg by mouth every 6 (six) hours as needed for headache or moderate pain., Disp: , Rfl:  .   Multiple Vitamin (MULTIVITAMIN WITH MINERALS) TABS tablet, Take 1 tablet by mouth every evening. Vita- Fusion Gummie, Disp: , Rfl:  .  Multiple Vitamins-Minerals (HAIR SKIN AND NAILS FORMULA) TABS, See admin instructions., Disp: , Rfl:  .  ondansetron (ZOFRAN ODT) 4 MG disintegrating tablet, 4mg  ODT q4 hours prn nausea/vomit (Patient taking differently: Take 4 mg by mouth every 4 (four) hours as needed for nausea or vomiting. ), Disp: 15 tablet, Rfl: 0 .  polyethylene glycol-electrolytes (NULYTELY) 420 g solution, peg-electrolyte solution 420 gram oral solution, Disp: , Rfl:  .  predniSONE (DELTASONE) 10 MG tablet, prednisone 10 mg tablet, Disp: , Rfl:  .  tamsulosin (FLOMAX) 0.4 MG CAPS capsule, Take 0.4 mg by mouth every evening. , Disp: , Rfl:  .  Tetrahydrozoline HCl (VISINE OP), Apply 1-2 drops to eye daily as needed (allergies.)., Disp: , Rfl:   Allergies  Allergen Reactions  . Aspirin     Stomach ache   . Brassica Oleracea   . Codeine     Hives   . Other Other (See Comments)    Melons + Nuts= makes throat itch.   . Penicillins     Hives         Objective:  Physical Exam  General: AAO x3,  NAD  Dermatological: Skin is warm, dry and supple bilateral. There are no open sores, no preulcerative lesions, no rash or signs of infection present.  Vascular: Dorsalis Pedis artery and Posterior Tibial artery pedal pulses are 2/4 bilateral with immedate capillary fill time.  There is no pain with calf compression, swelling, warmth, erythema.   Neruologic: Grossly intact via light touch bilateral.  Negative Tinel sign.  Musculoskeletal: There is tenderness palpation on the plantar lateral aspect of the calcaneus as well as plantar medial aspect the calcaneus insertion plantar fascia.  No pain with lateral compression of calcaneus.  No pain with Achilles tendon.  No pain on the peroneal tendon, flexor tendons.  No other area discomfort identified.  There is no pain with vibratory sensation.   Muscular strength 5/5 in all groups tested bilateral.  Equinus is present.  Gait: Unassisted, Nonantalgic.       Assessment:   60 year old female plan fasciitis right side     Plan:  -Treatment options discussed including all alternatives, risks, and complications -Etiology of symptoms were discussed -X-rays were obtained and reviewed with the patient.  There is no evidence of acute fracture or stress fracture identified today. -Medrol Dosepak prescribed. -Night Splint -Heel lift -Stretching, icing daily.  Discussed shoe modifications and orthotics.  Trula Slade DPM

## 2020-06-19 ENCOUNTER — Encounter: Payer: Self-pay | Admitting: Podiatry

## 2020-06-19 ENCOUNTER — Other Ambulatory Visit: Payer: Self-pay

## 2020-06-19 ENCOUNTER — Ambulatory Visit: Payer: 59 | Admitting: Podiatry

## 2020-06-19 DIAGNOSIS — G8929 Other chronic pain: Secondary | ICD-10-CM

## 2020-06-19 DIAGNOSIS — M79671 Pain in right foot: Secondary | ICD-10-CM

## 2020-06-19 DIAGNOSIS — M722 Plantar fascial fibromatosis: Secondary | ICD-10-CM | POA: Diagnosis not present

## 2020-06-19 NOTE — Patient Instructions (Signed)

## 2020-06-21 NOTE — Progress Notes (Signed)
Subjective: 60 year old female presents the office today for follow-up evaluation of right heel pain.  She says overall she is doing much better.  Is only had 1 or 2 instances of discomfort.  The pain that she was having in the morning is also much improved.  She has been continuing stretching as well as using a night splint.  Medrol Dosepak was also helpful.  No recent injury or trauma any changes otherwise. Denies any systemic complaints such as fevers, chills, nausea, vomiting. No acute changes since last appointment, and no other complaints at this time.   Objective: AAO x3, NAD DP/PT pulses palpable bilaterally, CRT less than 3 seconds On today's exam there is no significant tenderness palpation on plantar medial tubercle of the calcaneus at the insertion of the plantar fascia.  There is no area of pinpoint tenderness.  No pain the Achilles tendon.  Negative Tinel sign.  MMT 5/5.  No pain with calf compression, swelling, warmth, erythema  Assessment: 60 year old female with right heel pain and plantar fasciitis with improvement  Plan: -All treatment options discussed with the patient including all alternatives, risks, complications.  -Overall doing much better but we discussed continuing stretching, icing daily as well as using good arch supports to help prevent reoccurrence.  Anti-inflammatories as needed. -Patient encouraged to call the office with any questions, concerns, change in symptoms.   Return if symptoms worsen or fail to improve.  Trula Slade DPM

## 2021-10-09 ENCOUNTER — Other Ambulatory Visit: Payer: Self-pay | Admitting: Family Medicine

## 2021-10-09 DIAGNOSIS — Z1231 Encounter for screening mammogram for malignant neoplasm of breast: Secondary | ICD-10-CM

## 2021-10-11 ENCOUNTER — Ambulatory Visit
Admission: RE | Admit: 2021-10-11 | Discharge: 2021-10-11 | Disposition: A | Discharge status: Home / Self Care | Payer: 59 | Source: Ambulatory Visit | Attending: Family Medicine | Admitting: Family Medicine

## 2021-10-11 DIAGNOSIS — Z1231 Encounter for screening mammogram for malignant neoplasm of breast: Secondary | ICD-10-CM

## 2023-02-19 ENCOUNTER — Other Ambulatory Visit: Payer: Self-pay | Admitting: Family Medicine

## 2023-02-19 DIAGNOSIS — Z1231 Encounter for screening mammogram for malignant neoplasm of breast: Secondary | ICD-10-CM

## 2023-02-25 ENCOUNTER — Ambulatory Visit
Admission: RE | Admit: 2023-02-25 | Discharge: 2023-02-25 | Disposition: A | Discharge status: Home / Self Care | Payer: 59 | Source: Ambulatory Visit

## 2023-02-25 DIAGNOSIS — Z1231 Encounter for screening mammogram for malignant neoplasm of breast: Secondary | ICD-10-CM

## 2023-04-17 ENCOUNTER — Ambulatory Visit: Admission: EM | Admit: 2023-04-17 | Discharge: 2023-04-17 | Disposition: A | Discharge status: Home / Self Care

## 2023-04-17 ENCOUNTER — Encounter: Payer: Self-pay | Admitting: *Deleted

## 2023-04-17 DIAGNOSIS — J069 Acute upper respiratory infection, unspecified: Secondary | ICD-10-CM | POA: Diagnosis not present

## 2023-04-17 MED ORDER — PREDNISONE 20 MG PO TABS: 20.0000 mg | ORAL_TABLET | Freq: Two times a day (BID) | ORAL | 0 refills | Status: AC

## 2023-04-17 MED ORDER — ALBUTEROL SULFATE HFA 108 (90 BASE) MCG/ACT IN AERS: 2.0000 | INHALATION_SPRAY | Freq: Four times a day (QID) | RESPIRATORY_TRACT | 0 refills | Status: DC | PRN

## 2023-04-17 NOTE — Discharge Instructions (Addendum)
 Over-the-counter Delsym as needed for cough.  Does appear to be viral however given the worrisome cough I am prescribing a brief burst of prednisone 20 mg twice daily for 5 days.  Take prednisone with breakfast and dinner.  Albuterol inhaler prescribed 2 puffs every 4-6 hours as needed for persistent coughing or any shortness of breath or wheezing.  Follow-up with PCP or return here as needed.

## 2023-04-17 NOTE — ED Triage Notes (Signed)
 Pt reports irritating cough, sometimes productive with congestion. Sx have been on/off x 2 weeks. States she has hx of bronchitis. Denies fever.

## 2023-04-17 NOTE — ED Provider Notes (Signed)
 [facility]    CSN: [id number] Arrival date & time: 04/17/23  1031      History   Chief Complaint Chief Complaint  Patient presents with   Cough    HPI Sandra Vazquez is a 63 y.o. female.  Presents today with a productive cough and nasal congestion that has been on and off for approximately 2 weeks.  She denies any fever or shortness of breath.  Has a history of recurrent bronchitis. Past Medical History:  Diagnosis Date   Anemia    on iron    Patient Active Problem List   Diagnosis Date Noted   Acute pelvic pain 05/08/2020   Allergic rhinitis 05/08/2020   Bacterial vaginosis 05/08/2020   History of nephrolithiasis 05/08/2020   Hyperlipidemia 05/08/2020   Menstrual disorder 05/08/2020   Obesity 05/08/2020    Past Surgical History:  Procedure Laterality Date   CESAREAN SECTION     x2   COLONOSCOPY  02.29.2016   TUBAL LIGATION  1996   widsom teeth      OB History   No obstetric history on file.      Home Medications    Prior to Admission medications   Medication Sig Start Date End Date Taking? Authorizing Provider  albuterol (VENTOLIN HFA) 108 (90 Base) MCG/ACT inhaler Inhale 2 puffs into the lungs every 6 (six) hours as needed for wheezing or shortness of breath (Persistent cough). 04/17/23  Yes Bing Neighbors, NP  Apple Cider Vinegar 500 MG TABS    Yes [provider]  Ashwagandha 500 MG CAPS as directed Orally daily   Yes [provider]  Digestive Enzyme CAPS See admin instructions.   Yes [provider]  famotidine (PEPCID) 20 MG tablet 1 tablet as needed   Yes [provider]  fexofenadine (ALLEGRA) 180 MG tablet Take 180 mg by mouth daily as needed for allergies.    Yes [provider]  Multiple Vitamin (MULTIVITAMIN WITH MINERALS) TABS tablet Take 1 tablet by mouth every evening. Vita- Fusion Gummie   Yes [provider]  predniSONE (DELTASONE) 20 MG tablet Take 1 tablet (20 mg  total) by mouth 2 (two) times daily with a meal for 5 days. 04/17/23 04/22/23 Yes Bing Neighbors, NP  Alpha-D-Galactosidase [facility]) TABS Take 1 tablet by mouth daily as needed (enzymes when eating certain foods.).     [provider]  Biotin w/ Vitamins C & E (HAIR SKIN & NAILS GUMMIES PO)     [provider]  cyclobenzaprine (FLEXERIL) 5 MG tablet cyclobenzaprine 5 mg tablet Patient not taking: Reported on 04/17/2023    [provider]  ferrous sulfate 325 (65 FE) MG tablet Take 325 mg by mouth every evening.  Patient not taking: Reported on 04/17/2023    [provider]  fexofenadine (ALLEGRA ALLERGY) 60 MG tablet     [provider]  HYDROcodone-acetaminophen (NORCO/VICODIN) 5-325 MG per tablet Take 1 tablet by mouth every 6 (six) hours as needed for moderate pain. Patient not taking: Reported on 04/17/2023    [provider]  ibuprofen (ADVIL,MOTRIN) 200 MG tablet Take 200-400 mg by mouth every 6 (six) hours as needed for headache or moderate pain.    [provider]  methylPREDNISolone (MEDROL DOSEPAK) 4 MG TBPK tablet Take as directed Patient not taking: Reported on 04/17/2023 05/08/20   Vivi Barrack, DPM  Multiple Vitamins-Minerals (HAIR SKIN AND NAILS FORMULA) TABS See admin instructions. Patient not taking: Reported on 04/17/2023  [provider]  ondansetron (ZOFRAN ODT) 4 MG disintegrating tablet 4mg  ODT q4 hours prn nausea/vomit Patient not taking: Reported on 04/17/2023 03/31/14   Pricilla Loveless, MD  polyethylene glycol-electrolytes (NULYTELY) 420 g solution peg-electrolyte solution 420 gram oral solution Patient not taking: Reported on 04/17/2023    [provider]  tamsulosin (FLOMAX) 0.4 MG CAPS capsule Take 0.4 mg by mouth every evening.  Patient not taking: Reported on 04/17/2023    [provider]  Tetrahydrozoline HCl (VISINE OP) Apply 1-2 drops to eye daily as needed  (allergies.). Patient not taking: Reported on 04/17/2023    [provider]    Family History No family history on file.  Social History Social History   Tobacco Use   Smoking status: Never   Smokeless tobacco: Never  Substance Use Topics   Alcohol use: No   Drug use: No     Allergies   Aspirin, Brassica oleracea, Other, Pecan nut (diagnostic), Penicillin g, Penicillins, and Codeine   Review of Systems Review of Systems  Respiratory:  Positive for cough.      Physical Exam Triage Vital Signs ED Triage Vitals  Encounter Vitals Group     BP 04/17/23 1139 (!) 155/101     Systolic BP Percentile --      Diastolic BP Percentile --      Pulse Rate 04/17/23 1133 93     Resp 04/17/23 1133 18     Temp 04/17/23 1133 (!) 97.5 F (36.4 C)     Temp Source 04/17/23 1133 Oral     SpO2 04/17/23 1133 97 %     Weight --      Height --      Head Circumference --      Peak Flow --      Pain Score 04/17/23 1132 0     Pain Loc --      Pain Education --      Exclude from Growth Chart --    No data found.  Updated Vital Signs BP (!) 155/101 (BP Location: Left Arm)   Pulse 93   Temp (!) 97.5 F (36.4 C) (Oral)   Resp 18   SpO2 97%   Visual Acuity Right Eye Distance:   Left Eye Distance:   Bilateral Distance:    Right Eye Near:   Left Eye Near:    Bilateral Near:     Physical Exam Constitutional:      Appearance: Normal appearance.  HENT:     Head: Normocephalic and atraumatic.     Nose: Congestion present. No rhinorrhea.  Eyes:     Extraocular Movements: Extraocular movements intact.     Pupils: Pupils are equal, round, and reactive to light.  Cardiovascular:     Rate and Rhythm: Normal rate and regular rhythm.  Pulmonary:     Effort: Pulmonary effort is normal.     Breath sounds: Normal breath sounds.  Musculoskeletal:     Cervical back: Normal range of motion and neck supple.  Skin:    General: Skin is warm.  Neurological:     General: No focal  deficit present.     Mental Status: She is alert and oriented to person, place, and time.      UC Treatments / Results  Labs (all labs ordered are listed, but only abnormal results are displayed) Labs Reviewed - No data to display  EKG   Radiology No results found.  Procedures Procedures (including critical care time)  Medications Ordered  in UC Medications - No data to display  Initial Impression / Assessment and Plan / UC Course  I have reviewed the triage vital signs and the nursing notes.  Pertinent labs & imaging results that were available during my care of the patient were reviewed by me and considered in my medical decision making (see chart for details).   Acute viral upper respiratory illness, antibiotics are not indicated at this time.  Symptoms management indicated only.  Treatment per discharge medication orders. Follow-up with PCP or return here for evaluation if symptoms do not improve with treatment. Final Clinical Impressions(s) / UC Diagnoses   Final diagnoses:  Acute upper respiratory infection     Discharge Instructions      Over-the-counter Delsym as needed for cough.  Does appear to be viral however given the worrisome cough I am prescribing a brief burst of prednisone 20 mg twice daily for 5 days.  Take prednisone with breakfast and dinner.  Albuterol inhaler prescribed 2 puffs every 4-6 hours as needed for persistent coughing or any shortness of breath or wheezing.  Follow-up with PCP or return here as needed.     ED Prescriptions     Medication Sig Dispense Auth. Provider   predniSONE (DELTASONE) 20 MG tablet Take 1 tablet (20 mg total) by mouth 2 (two) times daily with a meal for 5 days. 10 tablet Bing Neighbors, NP   albuterol (VENTOLIN HFA) 108 (90 Base) MCG/ACT inhaler Inhale 2 puffs into the lungs every 6 (six) hours as needed for wheezing or shortness of breath (Persistent cough). 8 g Bing Neighbors, NP      PDMP not reviewed  this encounter.   Bing Neighbors, NP 04/17/23 1321

## 2023-12-16 HISTORY — PX: RETINAL LASER PROCEDURE: SHX2339

## 2024-01-19 ENCOUNTER — Ambulatory Visit
Admission: RE | Admit: 2024-01-19 | Discharge: 2024-01-19 | Disposition: A | Discharge status: Home / Self Care | Source: Ambulatory Visit

## 2024-01-19 VITALS — BP 158/101 | HR 97 | Temp 97.7°F | Resp 18 | Wt 182.0 lb

## 2024-01-19 DIAGNOSIS — J209 Acute bronchitis, unspecified: Secondary | ICD-10-CM | POA: Diagnosis not present

## 2024-01-19 MED ORDER — AZITHROMYCIN 250 MG PO TABS: ORAL_TABLET | ORAL | 0 refills | Status: AC

## 2024-01-19 MED ORDER — ALBUTEROL SULFATE HFA 108 (90 BASE) MCG/ACT IN AERS: 2.0000 | INHALATION_SPRAY | Freq: Four times a day (QID) | RESPIRATORY_TRACT | 2 refills | Status: AC | PRN

## 2024-01-19 MED ORDER — PREDNISONE 50 MG PO TABS: ORAL_TABLET | ORAL | 0 refills | Status: AC

## 2024-01-19 NOTE — ED Triage Notes (Signed)
 Possibly experiencing crackles when lying down for past 3 nights. First URI symptoms noticed 1 week ago/Jan 4 with onset of persistent cough, tiredness, low grade fever, with some symptoms improving by Thursday, except crackles? - Entered by patient  Pt presents c/o URI x 8 days. Pt denies emesis and diarrhea. Pt reports she has been taking Mucinex DM at bedtime.

## 2024-01-19 NOTE — Discharge Instructions (Addendum)
 Return if any problems.

## 2024-01-22 NOTE — ED Provider Notes (Signed)
 " [facility]    CSN: [id number] Arrival date & time: 01/19/24  1059      History   Chief Complaint Chief Complaint  Patient presents with   URI    Possibly experiencing crackles when lying down for past 3 nights. First URI symptoms noticed 1 week ago/Jan 4 with onset of persistent cough, tiredness, low grade fever, with some symptoms improving by Thursday, except crackles? - Entered by patient    HPI Sandra Vazquez is a 64 y.o. female.   Patient complains of cough and congestion.  Patient reports noisy sounds when lying down.  Patient reports she has had bronchitis in the past.  Patient reports symptoms began over a week ago.  No relief with over-the-counter medications.  Patient reports no current fever no chills.  Patient has had a persistent cough.  The history is provided by the patient. No language interpreter was used.  URI   Past Medical History:  Diagnosis Date   Anemia    on iron    Patient Active Problem List   Diagnosis Date Noted   Acute pelvic pain 05/08/2020   Allergic rhinitis 05/08/2020   Bacterial vaginosis 05/08/2020   History of nephrolithiasis 05/08/2020   Hyperlipidemia 05/08/2020   Menstrual disorder 05/08/2020   Obesity 05/08/2020    Past Surgical History:  Procedure Laterality Date   CESAREAN SECTION     x2   COLONOSCOPY  03/07/2014   RETINAL LASER PROCEDURE Bilateral 12/16/2023   TUBAL LIGATION  01/07/1994   widsom teeth      OB History   No obstetric history on file.      Home Medications    Prior to Admission medications  Medication Sig Start Date End Date Taking? Authorizing Provider  albuterol  (VENTOLIN  HFA) 108 (90 Base) MCG/ACT inhaler Inhale 2 puffs into the lungs every 6 (six) hours as needed for wheezing or shortness of breath. 01/19/24  Yes Flint Raring K, PA-C  azithromycin  (ZITHROMAX  Z-PAK) 250 MG tablet Take 2 tablets (500 mg) on  Day 1,  followed by 1 tablet (250 mg) once daily on Days 2 through 5.  01/19/24 01/24/24 Yes Trachelle Low K, PA-C  predniSONE  (DELTASONE ) 50 MG tablet One tablet a day 01/19/24  Yes Flint, Ulyssa Walthour K, PA-C  Alpha-D-Galactosidase (BEANO) TABS Take 1 tablet by mouth daily as needed (enzymes when eating certain foods.).     [provider]  Apple Cider Vinegar 500 MG TABS     [provider]  Ashwagandha 500 MG CAPS as directed Orally daily    [provider]  Biotin w/ Vitamins C & E (HAIR SKIN & NAILS GUMMIES PO)     [provider]  cyclobenzaprine (FLEXERIL) 5 MG tablet cyclobenzaprine 5 mg tablet Patient not taking: Reported on 04/17/2023    [provider]  Digestive Enzyme CAPS See admin instructions.    [provider]  famotidine (PEPCID) 20 MG tablet 1 tablet as needed    [provider]  ferrous sulfate 325 (65 FE) MG tablet Take 325 mg by mouth every evening.  Patient not taking: Reported on 04/17/2023    [provider]  fexofenadine (ALLEGRA ALLERGY) 60 MG tablet     [provider]  fexofenadine (ALLEGRA) 180 MG tablet Take 180 mg by mouth daily as needed for allergies.     [provider]  HYDROcodone-acetaminophen (NORCO/VICODIN) 5-325 MG per tablet Take 1 tablet by mouth every 6 (six) hours as needed for moderate  pain. Patient not taking: Reported on 04/17/2023    [provider]  ibuprofen (ADVIL,MOTRIN) 200 MG tablet Take 200-400 mg by mouth every 6 (six) hours as needed for headache or moderate pain.    [provider]  methylPREDNISolone  (MEDROL  DOSEPAK) 4 MG TBPK tablet Take as directed Patient not taking: Reported on 04/17/2023 05/08/20   Gershon Donnice SAUNDERS, DPM  Multiple Vitamin (MULTIVITAMIN WITH MINERALS) TABS tablet Take 1 tablet by mouth every evening. Vita- Fusion Gummie    [provider]  Multiple Vitamins-Minerals (HAIR SKIN AND NAILS FORMULA) TABS See admin instructions. Patient not taking: Reported on 04/17/2023    [provider]  ondansetron  (ZOFRAN  ODT) 4 MG disintegrating tablet 4mg  ODT q4 hours prn nausea/vomit Patient not taking: Reported on 04/17/2023 03/31/14   Freddi Hamilton, MD  polyethylene glycol-electrolytes (NULYTELY) 420 g solution peg-electrolyte solution 420 gram oral solution Patient not taking: Reported on 04/17/2023    [provider]  Polyvinyl Alcohol-Povidone PF 1.4-0.6 % SOLN as directed Ophthalmic    [provider]  tamsulosin (FLOMAX) 0.4 MG CAPS capsule Take 0.4 mg by mouth every evening.  Patient not taking: Reported on 04/17/2023    [provider]  Tetrahydrozoline HCl (VISINE OP) Apply 1-2 drops to eye daily as needed (allergies.). Patient not taking: Reported on 04/17/2023    [provider]    Family History History reviewed. No pertinent family history.  Social History Social History[1]   Allergies   Aspirin, Brassica oleracea, Other, Pecan nut (diagnostic), Penicillin g, Penicillins, and Codeine   Review of Systems Review of Systems  All other systems reviewed and are negative.    Physical Exam Triage Vital Signs ED Triage Vitals  Encounter Vitals Group     BP 01/19/24 1113 (!) 158/101     Girls Systolic BP Percentile --      Girls Diastolic BP Percentile --      Boys Systolic BP Percentile --      Boys Diastolic BP Percentile --      Pulse Rate 01/19/24 1113 97     Resp 01/19/24 1113 18     Temp 01/19/24 1113 97.7 F (36.5 C)     Temp Source 01/19/24 1113 Oral     SpO2 01/19/24 1113 98 %     Weight 01/19/24 1111 182 lb (82.6 kg)     Height --      Head Circumference --      Peak Flow --      Pain Score 01/19/24 1110 0     Pain Loc --      Pain Education --      Exclude from Growth Chart --    No data found.  Updated Vital Signs BP (!) 158/101 (BP Location: Left Arm)   Pulse 97   Temp 97.7 F (36.5 C) (Oral)   Resp 18   Wt 82.6 kg   SpO2 98%   BMI 34.96 kg/m   Visual Acuity Right Eye Distance:    Left Eye Distance:   Bilateral Distance:    Right Eye Near:   Left Eye Near:    Bilateral Near:     Physical Exam Vitals and nursing note reviewed.  Constitutional:      Appearance: She is well-developed.  HENT:     Head: Normocephalic.  Cardiovascular:     Rate and Rhythm: Normal rate.  Pulmonary:     Effort: Pulmonary effort is normal.     Comments: Rhonchi Abdominal:  General: There is no distension.  Musculoskeletal:        General: Normal range of motion.     Cervical back: Normal range of motion.  Skin:    General: Skin is warm.  Neurological:     General: No focal deficit present.     Mental Status: She is alert and oriented to person, place, and time.      UC Treatments / Results  Labs (all labs ordered are listed, but only abnormal results are displayed) Labs Reviewed - No data to display  EKG   Radiology No results found.  Procedures Procedures (including critical care time)  Medications Ordered in UC Medications - No data to display  Initial Impression / Assessment and Plan / UC Course  I have reviewed the triage vital signs and the nursing notes.  Pertinent labs & imaging results that were available during my care of the patient were reviewed by me and considered in my medical decision making (see chart for details).    Patient is given a prescription for doxycycline albuterol  and prednisone .  Patient is advised to follow-up with primary care for recheck.  Patient is advised to return if symptoms worsen or change.  Patient is discharged in stable condition  Final Clinical Impressions(s) / UC Diagnoses   Final diagnoses:  Acute bronchitis, unspecified organism     Discharge Instructions      Return if any problems.     ED Prescriptions     Medication Sig Dispense Auth. Provider   azithromycin  (ZITHROMAX  Z-PAK) 250 MG tablet Take 2 tablets (500 mg) on  Day 1,  followed by 1 tablet (250 mg) once daily on Days 2 through 5. 6 each  Breiona Couvillon K, PA-C   albuterol  (VENTOLIN  HFA) 108 (90 Base) MCG/ACT inhaler Inhale 2 puffs into the lungs every 6 (six) hours as needed for wheezing or shortness of breath. 8 g Bracken Moffa K, PA-C   predniSONE  (DELTASONE ) 50 MG tablet One tablet a day 5 tablet Cordella Nyquist K, PA-C      PDMP not reviewed this encounter. An After Visit Summary was printed and given to the patient.        [1]  Social History Tobacco Use   Smoking status: Never    Passive exposure: Never   Smokeless tobacco: Never  Vaping Use   Vaping status: Never Used  Substance Use Topics   Alcohol use: No   Drug use: No     Flint Sonny POUR, PA-C 01/22/24 0028  "
